# Patient Record
Sex: Female | Born: 1935 | Hispanic: Yes | Smoking: Never smoker
Health system: Southern US, Community
[De-identification: ages and names within clinical notes are randomized; demographics above are authoritative.]

## PROBLEM LIST (undated history)

## (undated) DIAGNOSIS — K279 Peptic ulcer, site unspecified, unspecified as acute or chronic, without hemorrhage or perforation: Secondary | ICD-10-CM

## (undated) DIAGNOSIS — K219 Gastro-esophageal reflux disease without esophagitis: Secondary | ICD-10-CM

## (undated) DIAGNOSIS — A159 Respiratory tuberculosis unspecified: Secondary | ICD-10-CM

## (undated) DIAGNOSIS — J45909 Unspecified asthma, uncomplicated: Secondary | ICD-10-CM

## (undated) HISTORY — PX: ABDOMINAL HYSTERECTOMY: SHX81

## (undated) HISTORY — PX: EYE SURGERY: SHX253

---

## 2017-01-17 ENCOUNTER — Inpatient Hospital Stay (HOSPITAL_COMMUNITY)
Admission: EM | Admit: 2017-01-17 | Discharge: 2017-01-20 | DRG: 369 | Disposition: A | Discharge status: Home / Self Care | Payer: Medicare Other | Attending: Internal Medicine | Admitting: Internal Medicine

## 2017-01-17 ENCOUNTER — Encounter (HOSPITAL_COMMUNITY): Payer: Self-pay

## 2017-01-17 DIAGNOSIS — R451 Restlessness and agitation: Secondary | ICD-10-CM | POA: Diagnosis present

## 2017-01-17 DIAGNOSIS — D500 Iron deficiency anemia secondary to blood loss (chronic): Secondary | ICD-10-CM | POA: Diagnosis not present

## 2017-01-17 DIAGNOSIS — F0280 Dementia in other diseases classified elsewhere without behavioral disturbance: Secondary | ICD-10-CM | POA: Diagnosis present

## 2017-01-17 DIAGNOSIS — K2961 Other gastritis with bleeding: Secondary | ICD-10-CM | POA: Diagnosis present

## 2017-01-17 DIAGNOSIS — K2960 Other gastritis without bleeding: Secondary | ICD-10-CM

## 2017-01-17 DIAGNOSIS — F0390 Unspecified dementia without behavioral disturbance: Secondary | ICD-10-CM | POA: Diagnosis not present

## 2017-01-17 DIAGNOSIS — K922 Gastrointestinal hemorrhage, unspecified: Secondary | ICD-10-CM | POA: Diagnosis present

## 2017-01-17 DIAGNOSIS — K219 Gastro-esophageal reflux disease without esophagitis: Secondary | ICD-10-CM | POA: Diagnosis present

## 2017-01-17 DIAGNOSIS — D62 Acute posthemorrhagic anemia: Secondary | ICD-10-CM | POA: Diagnosis present

## 2017-01-17 DIAGNOSIS — R05 Cough: Secondary | ICD-10-CM | POA: Diagnosis not present

## 2017-01-17 DIAGNOSIS — K921 Melena: Secondary | ICD-10-CM | POA: Diagnosis present

## 2017-01-17 DIAGNOSIS — G309 Alzheimer's disease, unspecified: Secondary | ICD-10-CM | POA: Diagnosis present

## 2017-01-17 DIAGNOSIS — K920 Hematemesis: Secondary | ICD-10-CM

## 2017-01-17 DIAGNOSIS — Z8711 Personal history of peptic ulcer disease: Secondary | ICD-10-CM

## 2017-01-17 DIAGNOSIS — E876 Hypokalemia: Secondary | ICD-10-CM | POA: Diagnosis present

## 2017-01-17 DIAGNOSIS — K319 Disease of stomach and duodenum, unspecified: Secondary | ICD-10-CM | POA: Diagnosis present

## 2017-01-17 DIAGNOSIS — K226 Gastro-esophageal laceration-hemorrhage syndrome: Secondary | ICD-10-CM | POA: Diagnosis present

## 2017-01-17 DIAGNOSIS — D72829 Elevated white blood cell count, unspecified: Secondary | ICD-10-CM | POA: Diagnosis present

## 2017-01-17 DIAGNOSIS — Z90710 Acquired absence of both cervix and uterus: Secondary | ICD-10-CM

## 2017-01-17 DIAGNOSIS — R059 Cough, unspecified: Secondary | ICD-10-CM

## 2017-01-17 HISTORY — DX: Unspecified asthma, uncomplicated: J45.909

## 2017-01-17 HISTORY — DX: Gastro-esophageal reflux disease without esophagitis: K21.9

## 2017-01-17 HISTORY — DX: Respiratory tuberculosis unspecified: A15.9

## 2017-01-17 HISTORY — DX: Peptic ulcer, site unspecified, unspecified as acute or chronic, without hemorrhage or perforation: K27.9

## 2017-01-17 LAB — COMPREHENSIVE METABOLIC PANEL
ALT: 15 U/L (ref 14–54)
ANION GAP: 12 (ref 5–15)
AST: 29 U/L (ref 15–41)
Albumin: 3.3 g/dL — ABNORMAL LOW (ref 3.5–5.0)
Alkaline Phosphatase: 59 U/L (ref 38–126)
BUN: 36 mg/dL — ABNORMAL HIGH (ref 6–20)
CHLORIDE: 99 mmol/L — AB (ref 101–111)
CO2: 23 mmol/L (ref 22–32)
CREATININE: 0.8 mg/dL (ref 0.44–1.00)
Calcium: 8.4 mg/dL — ABNORMAL LOW (ref 8.9–10.3)
Glucose, Bld: 118 mg/dL — ABNORMAL HIGH (ref 65–99)
Potassium: 3 mmol/L — ABNORMAL LOW (ref 3.5–5.1)
Sodium: 134 mmol/L — ABNORMAL LOW (ref 135–145)
Total Bilirubin: 0.4 mg/dL (ref 0.3–1.2)
Total Protein: 6.2 g/dL — ABNORMAL LOW (ref 6.5–8.1)

## 2017-01-17 LAB — TYPE AND SCREEN
ABO/RH(D): O POS
ANTIBODY SCREEN: NEGATIVE

## 2017-01-17 LAB — CBC
HCT: 35.3 % — ABNORMAL LOW (ref 36.0–46.0)
Hemoglobin: 11.4 g/dL — ABNORMAL LOW (ref 12.0–15.0)
MCH: 27.3 pg (ref 26.0–34.0)
MCHC: 32.3 g/dL (ref 30.0–36.0)
MCV: 84.7 fL (ref 78.0–100.0)
PLATELETS: 420 10*3/uL — AB (ref 150–400)
RBC: 4.17 MIL/uL (ref 3.87–5.11)
RDW: 12.6 % (ref 11.5–15.5)
WBC: 15.8 10*3/uL — AB (ref 4.0–10.5)

## 2017-01-17 LAB — ABO/RH: ABO/RH(D): O POS

## 2017-01-17 LAB — PHOSPHORUS: PHOSPHORUS: 3.5 mg/dL (ref 2.5–4.6)

## 2017-01-17 LAB — POC OCCULT BLOOD, ED: Fecal Occult Bld: POSITIVE — AB

## 2017-01-17 LAB — MAGNESIUM: Magnesium: 1.9 mg/dL (ref 1.7–2.4)

## 2017-01-17 MED ORDER — SODIUM CHLORIDE 0.9 % IV SOLN
80.0000 mg | Freq: Once | INTRAVENOUS | Status: AC
  Administered 2017-01-17: 80 mg via INTRAVENOUS
  Filled 2017-01-17: qty 80

## 2017-01-17 MED ORDER — SODIUM CHLORIDE 0.9 % IV SOLN
INTRAVENOUS | Status: DC
  Administered 2017-01-17 – 2017-01-18 (×2): via INTRAVENOUS

## 2017-01-17 MED ORDER — PANTOPRAZOLE SODIUM 40 MG IV SOLR
8.0000 mg/h | INTRAVENOUS | Status: DC
  Administered 2017-01-17: 8 mg/h via INTRAVENOUS
  Filled 2017-01-17 (×3): qty 80

## 2017-01-17 MED ORDER — LORAZEPAM 2 MG/ML IJ SOLN
0.5000 mg | INTRAMUSCULAR | Status: DC | PRN
  Administered 2017-01-17: 0.5 mg via INTRAVENOUS
  Filled 2017-01-17: qty 1

## 2017-01-17 MED ORDER — SODIUM CHLORIDE 0.9 % IV BOLUS (SEPSIS)
1000.0000 mL | Freq: Once | INTRAVENOUS | Status: AC
  Administered 2017-01-17: 1000 mL via INTRAVENOUS

## 2017-01-17 NOTE — ED Triage Notes (Signed)
Pt brought in by EMS, called in by family who reports pt was near syncope, and was not responding at baseline per family.  Pt has had a productive cough for 3 days with clear sputum, and pt began coughing up bright red blood right before EMS arrival. Pt has dementia and is at combative and spit on [employer].  Per EMS upon transport pt had a LOC  For about 30 seconds and when pt became aroused vomited dark red emesis.  Per EMS BP 140/80 then to 103/48,  hr 116, CBG 112, and O2 saturation 95%. Pt was given 4 mg of Zofran.  Per family  pt has not been sick , however has had a poor appetite, and generalized weakness.

## 2017-01-17 NOTE — ED Notes (Signed)
Bed: RESA Expected date:  Expected time:  Means of arrival:  Comments: EMS GI bleed/syncopal-hypotension/orthostatic/dementia

## 2017-01-17 NOTE — ED Provider Notes (Addendum)
[facility]-EMERGENCY DEPT Provider Note   CSN: [id number] Arrival date & time: 01/17/17  1940     History   Chief Complaint Chief Complaint  Patient presents with  . Emesis  . Near Syncope  . Weakness    HPI Sylvia Schwartz is a 83 y.o. female.  HPI  Presenting with hematemesis.  History obtained via spanish interpreter with patient's daughter. Level V caveat due to dementia. History of alzheimer's dementia. Has [place] No prior history of Gi bleed. No blood thinners or use of NSAIDs. Onset of dark hematemesis earlier this afternoon, once at home and once with EMS. Single episode of dark stool with EMS. No abdominal pain, fevers, complaints of chest pain or shortness of breath. Patient was lightheaded and near syncopal per daughter and with EMS, did have syncopal episode. She has had generalized weakness and poor appetite    History reviewed. No pertinent past medical history.  There are no active problems to display for this patient.   No past surgical history on file.  OB History    No data available       Home Medications    Prior to Admission medications   Medication Sig Start Date End Date Taking? Authorizing Provider  guaiFENesin (ROBITUSSIN) 100 MG/5ML liquid Take 200 mg by mouth 3 (three) times daily as needed for cough.   Yes Historical Provider, MD    Family History History reviewed. No pertinent family history.  Social History Social History  Substance Use Topics  . Smoking status: Not on file  . Smokeless tobacco: Not on file  . Alcohol use No     Allergies   Patient has no known allergies.   Review of Systems Review of Systems Unable to obtain due to dementia  Physical Exam Updated Vital Signs BP 116/57 (BP Location: Left Arm)   Pulse 100   Resp 25   SpO2 95%   Physical Exam Physical Exam  Nursing note and vitals reviewed. Constitutional: non-toxic, and in no acute  distress Head: Normocephalic and atraumatic.  Mouth/Throat: Oropharynx is clear and dry.  Neck: Normal range of motion. Neck supple.  Cardiovascular: Tachycardic rate and regular rhythm.  no edema Pulmonary/Chest: Effort normal and breath sounds normal.  Abdominal: Soft. There is no tenderness. There is no rebound and no guarding.  Rectal: melena, guaiac positive stool Musculoskeletal: Normal range of motion.  Neurological: Alert, no facial droop, moves all extremities symmetrically Skin: Skin is warm and dry.  Psychiatric: Cooperative   ED Treatments / Results  Labs (all labs ordered are listed, but only abnormal results are displayed) Labs Reviewed  COMPREHENSIVE METABOLIC PANEL - Abnormal; Notable for the following:       Result Value   Sodium 134 (*)    Potassium 3.0 (*)    Chloride 99 (*)    Glucose, Bld 118 (*)    BUN 36 (*)    Calcium 8.4 (*)    Total Protein 6.2 (*)    Albumin 3.3 (*)    All other components within normal limits  CBC - Abnormal; Notable for the following:    WBC 15.8 (*)    Hemoglobin 11.4 (*)    HCT 35.3 (*)    Platelets 420 (*)    All other components within normal limits  POC OCCULT BLOOD, ED - Abnormal; Notable for the following:    Fecal Occult Bld POSITIVE (*)    All other components within  normal limits  TYPE AND SCREEN  ABO/RH    EKG  EKG Interpretation None       Radiology No results found.  Procedures Procedures (including critical care time)  Medications Ordered in ED Medications  sodium chloride 0.9 % bolus 1,000 mL (not administered)    And  0.9 %  sodium chloride infusion (not administered)  pantoprazole (PROTONIX) 80 mg in sodium chloride 0.9 % 100 mL IVPB (not administered)  pantoprazole (PROTONIX) 80 mg in sodium chloride 0.9 % 250 mL (0.32 mg/mL) infusion (not administered)     Initial Impression / Assessment and Plan / ED Course  I have reviewed the triage vital signs and the nursing notes.  Pertinent labs  & imaging results that were available during my care of the patient were reviewed by me and considered in my medical decision making (see chart for details).     Presenting with concern for UGIB. Tachycardic, but normotensive and no respiratory distress. Abdomen soft and benign. Melena on rectal exam. Blood work with anemia 11.4, no baseline. Increased BUN/Cr ratio. Placed on protonix drip for UGIB. Discussed with Dr. Lavon PaganiniNandigam who will plan to perform endoscopy likely tomorrow morning. Plan to admit to hospitalist service for ongoing management. Discussed with Dr. Robb Matarrtiz  Final Clinical Impressions(s) / ED Diagnoses   Final diagnoses:  UGIB (upper gastrointestinal bleed)  Melena    New Prescriptions New Prescriptions   No medications on file     Lavera Guiseana Duo Jerelene Salaam, MD 01/17/17 [id number]    Lavera Guiseana Duo Jesseka Drinkard, MD 01/17/17 2314

## 2017-01-17 NOTE — ED Notes (Addendum)
Pt refusing EKG, EDP Liu notified and aware.

## 2017-01-18 ENCOUNTER — Inpatient Hospital Stay (HOSPITAL_COMMUNITY): Payer: Medicare Other

## 2017-01-18 ENCOUNTER — Inpatient Hospital Stay (HOSPITAL_COMMUNITY): Payer: Medicare Other | Admitting: Anesthesiology

## 2017-01-18 ENCOUNTER — Encounter (HOSPITAL_COMMUNITY)
Admission: EM | Disposition: A | Discharge status: Home / Self Care | Payer: Self-pay | Source: Home / Self Care | Attending: Internal Medicine

## 2017-01-18 ENCOUNTER — Encounter (HOSPITAL_COMMUNITY): Payer: Self-pay | Admitting: Anesthesiology

## 2017-01-18 DIAGNOSIS — R451 Restlessness and agitation: Secondary | ICD-10-CM | POA: Diagnosis present

## 2017-01-18 DIAGNOSIS — F0390 Unspecified dementia without behavioral disturbance: Secondary | ICD-10-CM | POA: Diagnosis present

## 2017-01-18 DIAGNOSIS — K920 Hematemesis: Secondary | ICD-10-CM

## 2017-01-18 DIAGNOSIS — K226 Gastro-esophageal laceration-hemorrhage syndrome: Principal | ICD-10-CM

## 2017-01-18 DIAGNOSIS — K922 Gastrointestinal hemorrhage, unspecified: Secondary | ICD-10-CM

## 2017-01-18 DIAGNOSIS — K921 Melena: Secondary | ICD-10-CM

## 2017-01-18 DIAGNOSIS — K2960 Other gastritis without bleeding: Secondary | ICD-10-CM

## 2017-01-18 DIAGNOSIS — D500 Iron deficiency anemia secondary to blood loss (chronic): Secondary | ICD-10-CM

## 2017-01-18 HISTORY — PX: ESOPHAGOGASTRODUODENOSCOPY (EGD) WITH PROPOFOL: SHX5813

## 2017-01-18 LAB — COMPREHENSIVE METABOLIC PANEL
ALBUMIN: 3.4 g/dL — AB (ref 3.5–5.0)
ALK PHOS: 54 U/L (ref 38–126)
ALT: 17 U/L (ref 14–54)
AST: 33 U/L (ref 15–41)
Anion gap: 8 (ref 5–15)
BUN: 36 mg/dL — AB (ref 6–20)
CALCIUM: 8.4 mg/dL — AB (ref 8.9–10.3)
CO2: 27 mmol/L (ref 22–32)
CREATININE: 0.71 mg/dL (ref 0.44–1.00)
Chloride: 104 mmol/L (ref 101–111)
GFR calc Af Amer: >60 mL/min (ref >60)
GLUCOSE: 87 mg/dL (ref 65–99)
Potassium: 3.5 mmol/L (ref 3.5–5.1)
Sodium: 139 mmol/L (ref 135–145)
Total Bilirubin: 0.6 mg/dL (ref 0.3–1.2)
Total Protein: 6.3 g/dL — ABNORMAL LOW (ref 6.5–8.1)

## 2017-01-18 LAB — CBC WITH DIFFERENTIAL/PLATELET
BASOS ABS: 0 10*3/uL (ref 0.0–0.1)
BASOS PCT: 0 %
EOS ABS: 0.7 10*3/uL (ref 0.0–0.7)
EOS PCT: 7 %
HCT: 29.5 % — ABNORMAL LOW (ref 36.0–46.0)
Hemoglobin: 9.7 g/dL — ABNORMAL LOW (ref 12.0–15.0)
LYMPHS PCT: 23 %
Lymphs Abs: 2.1 10*3/uL (ref 0.7–4.0)
MCH: 27.9 pg (ref 26.0–34.0)
MCHC: 32.9 g/dL (ref 30.0–36.0)
MCV: 84.8 fL (ref 78.0–100.0)
MONO ABS: 0.8 10*3/uL (ref 0.1–1.0)
Monocytes Relative: 9 %
Neutro Abs: 5.5 10*3/uL (ref 1.7–7.7)
Neutrophils Relative %: 61 %
PLATELETS: 387 10*3/uL (ref 150–400)
RBC: 3.48 MIL/uL — ABNORMAL LOW (ref 3.87–5.11)
RDW: 12.9 % (ref 11.5–15.5)
WBC: 9.1 10*3/uL (ref 4.0–10.5)

## 2017-01-18 LAB — URINALYSIS, ROUTINE W REFLEX MICROSCOPIC
Bilirubin Urine: NEGATIVE
GLUCOSE, UA: NEGATIVE mg/dL
KETONES UR: NEGATIVE mg/dL
NITRITE: NEGATIVE
PROTEIN: NEGATIVE mg/dL
Specific Gravity, Urine: 1.023 (ref 1.005–1.030)
pH: 6 (ref 5.0–8.0)

## 2017-01-18 LAB — TROPONIN I
TROPONIN I: 0.03 ng/mL — AB (ref <0.03)
Troponin I: 0.04 ng/mL (ref <0.03)

## 2017-01-18 LAB — MRSA PCR SCREENING: MRSA BY PCR: NEGATIVE

## 2017-01-18 LAB — CBG MONITORING, ED: Glucose-Capillary: 70 mg/dL (ref 65–99)

## 2017-01-18 SURGERY — ESOPHAGOGASTRODUODENOSCOPY (EGD) WITH PROPOFOL
Anesthesia: Monitor Anesthesia Care

## 2017-01-18 MED ORDER — SODIUM CHLORIDE 0.9% FLUSH
3.0000 mL | Freq: Two times a day (BID) | INTRAVENOUS | Status: DC
  Administered 2017-01-19: 3 mL via INTRAVENOUS

## 2017-01-18 MED ORDER — SODIUM CHLORIDE 0.9 % IV SOLN
INTRAVENOUS | Status: AC
  Administered 2017-01-18 – 2017-01-19 (×2): via INTRAVENOUS
  Filled 2017-01-18 (×3): qty 1000

## 2017-01-18 MED ORDER — HALOPERIDOL LACTATE 5 MG/ML IJ SOLN
2.0000 mg | Freq: Four times a day (QID) | INTRAMUSCULAR | Status: DC | PRN
  Administered 2017-01-18: 2 mg via INTRAVENOUS
  Filled 2017-01-18 (×2): qty 1

## 2017-01-18 MED ORDER — PROPOFOL 10 MG/ML IV BOLUS
INTRAVENOUS | Status: DC | PRN
  Administered 2017-01-18: 150 mg via INTRAVENOUS

## 2017-01-18 MED ORDER — PANTOPRAZOLE SODIUM 40 MG IV SOLR
40.0000 mg | Freq: Two times a day (BID) | INTRAVENOUS | Status: DC
  Administered 2017-01-18: 40 mg via INTRAVENOUS
  Filled 2017-01-18 (×2): qty 40

## 2017-01-18 MED ORDER — ONDANSETRON HCL 4 MG/2ML IJ SOLN
INTRAMUSCULAR | Status: AC
  Filled 2017-01-18: qty 2

## 2017-01-18 MED ORDER — ORAL CARE MOUTH RINSE: 15.0000 mL | Freq: Two times a day (BID) | OROMUCOSAL | Status: DC

## 2017-01-18 MED ORDER — PROPOFOL 10 MG/ML IV BOLUS
INTRAVENOUS | Status: AC
  Filled 2017-01-18: qty 20

## 2017-01-18 MED ORDER — HALOPERIDOL LACTATE 5 MG/ML IJ SOLN
3.0000 mg | Freq: Four times a day (QID) | INTRAMUSCULAR | Status: DC | PRN
  Filled 2017-01-18: qty 1

## 2017-01-18 MED ORDER — QUETIAPINE FUMARATE 50 MG PO TABS
25.0000 mg | ORAL_TABLET | Freq: Every day | ORAL | Status: DC
  Administered 2017-01-18: 25 mg via ORAL
  Filled 2017-01-18: qty 1

## 2017-01-18 MED ORDER — LACTATED RINGERS IV SOLN
INTRAVENOUS | Status: DC
  Administered 2017-01-18: 13:00:00 via INTRAVENOUS
  Administered 2017-01-19: 1000 mL via INTRAVENOUS

## 2017-01-18 MED ORDER — ONDANSETRON HCL 4 MG/2ML IJ SOLN
4.0000 mg | Freq: Four times a day (QID) | INTRAMUSCULAR | Status: DC | PRN
  Administered 2017-01-18: 4 mg via INTRAVENOUS

## 2017-01-18 MED ORDER — CHLORHEXIDINE GLUCONATE 0.12 % MT SOLN
15.0000 mL | Freq: Two times a day (BID) | OROMUCOSAL | Status: DC
  Filled 2017-01-18: qty 15

## 2017-01-18 MED ORDER — SODIUM CHLORIDE 0.9% FLUSH
3.0000 mL | Freq: Two times a day (BID) | INTRAVENOUS | Status: DC
  Administered 2017-01-18: 3 mL via INTRAVENOUS

## 2017-01-18 MED ORDER — HALOPERIDOL LACTATE 5 MG/ML IJ SOLN
2.0000 mg | INTRAMUSCULAR | Status: AC
  Administered 2017-01-18: 2 mg via INTRAVENOUS
  Filled 2017-01-18: qty 1

## 2017-01-18 MED ORDER — FENTANYL CITRATE (PF) 100 MCG/2ML IJ SOLN
INTRAMUSCULAR | Status: DC | PRN
  Administered 2017-01-18: 100 ug via INTRAVENOUS

## 2017-01-18 MED ORDER — PANTOPRAZOLE SODIUM 40 MG PO TBEC
40.0000 mg | DELAYED_RELEASE_TABLET | Freq: Two times a day (BID) | ORAL | Status: DC
  Administered 2017-01-18: 40 mg via ORAL
  Filled 2017-01-18 (×2): qty 1

## 2017-01-18 MED ORDER — DEXAMETHASONE SODIUM PHOSPHATE 10 MG/ML IJ SOLN
INTRAMUSCULAR | Status: AC
  Filled 2017-01-18: qty 1

## 2017-01-18 MED ORDER — FENTANYL CITRATE (PF) 100 MCG/2ML IJ SOLN
INTRAMUSCULAR | Status: AC
  Filled 2017-01-18: qty 2

## 2017-01-18 MED ORDER — ONDANSETRON HCL 4 MG PO TABS: 4.0000 mg | ORAL_TABLET | Freq: Four times a day (QID) | ORAL | Status: DC | PRN

## 2017-01-18 MED ORDER — HALOPERIDOL LACTATE 5 MG/ML IJ SOLN
4.0000 mg | INTRAMUSCULAR | Status: AC
  Administered 2017-01-18: 4 mg via INTRAVENOUS

## 2017-01-18 MED ORDER — SODIUM CHLORIDE 0.9 % IV SOLN: INTRAVENOUS | Status: DC

## 2017-01-18 MED ORDER — LIDOCAINE 2% (20 MG/ML) 5 ML SYRINGE
INTRAMUSCULAR | Status: DC | PRN
  Administered 2017-01-18: 100 mg via INTRAVENOUS

## 2017-01-18 MED ORDER — POTASSIUM CHLORIDE IN NACL 40-0.9 MEQ/L-% IV SOLN
INTRAVENOUS | Status: AC
  Administered 2017-01-18: 75 mL/h via INTRAVENOUS
  Filled 2017-01-18: qty 1000

## 2017-01-18 MED ORDER — SUCCINYLCHOLINE CHLORIDE 200 MG/10ML IV SOSY
PREFILLED_SYRINGE | INTRAVENOUS | Status: DC | PRN
  Administered 2017-01-18: 100 mg via INTRAVENOUS

## 2017-01-18 SURGICAL SUPPLY — 14 items

## 2017-01-18 NOTE — Anesthesia Procedure Notes (Signed)
Procedure Name: Intubation Date/Time: 01/18/2017 1:40 PM Performed by: Rex Oesterle, Virgel Gess Pre-anesthesia Checklist: Patient identified, Emergency Drugs available, Suction available, Patient being monitored and Timeout performed Patient Re-evaluated:Patient Re-evaluated prior to inductionOxygen Delivery Method: Circle system utilized Preoxygenation: Pre-oxygenation with 100% oxygen Intubation Type: IV induction, Cricoid Pressure applied and Rapid sequence Laryngoscope Size: Mac and 4 Grade View: Grade I Tube type: Oral Tube size: 7.5 mm Number of attempts: 1 Airway Equipment and Method: Stylet Placement Confirmation: ETT inserted through vocal cords under direct vision,  positive ETCO2,  CO2 detector and breath sounds checked- equal and bilateral Secured at: 22 cm Tube secured with: Tape Dental Injury: Teeth and Oropharynx as per pre-operative assessment

## 2017-01-18 NOTE — ED Notes (Signed)
CONTACT INFORMATION  MARIE [phone] MOBILE               [phone] WORK

## 2017-01-18 NOTE — ED Notes (Addendum)
CONTACTED X-RAY-REQUESTED  2ND ATTEMPT FOR PORTABLE BEFORE TRANSFER TO 2 WEST

## 2017-01-18 NOTE — ED Notes (Addendum)
BEDSIDE REPORT. PER C. CELESTIAL RN. PT DECLINES TO WEAR CARDIAC MONITOR, DECLINED EKG. SPOKE WITH ASSIST. DIRECTOR JENNIFER H. RN AND REQUESTED TO ASSIST WITH IV  WHEN SHE RETURNED TO FLOOR. SHE AGREED.

## 2017-01-18 NOTE — Progress Notes (Signed)
Patient refusing care. Patient now has soft waist belt applied and Haldol given. Patient sleeping. Family wants patient to sleep. Care for 0000 (CBG) refused. Will continue to monitor patient. Will spot check BP and O2 since patient has pulled off lines.

## 2017-01-18 NOTE — ED Notes (Signed)
IV Team consult @ bedside

## 2017-01-18 NOTE — H&P (Signed)
History and Physical    Sylvia Schwartz ZOX:[id number] DOB: 1928/04/18 DOA: 01/17/2017  PCP: No PCP Per Patient   Patient coming from:  Chief Complaint: Hematemesis  HPI: Sylvia Schwartz is a 83 y.o. female with medical history significant of peptic ulcer disease 17 or 18 years ago, dementia who was brought to the emergency department due hematemesis at home and another episode while being transported by EMS. She has been having productive cough with clear sputum, poor appetite and generalized weakness for the past 3 days, but no fever, chills, complaints of chest or abdominal pain. She does not use aspirin or anticoagulants. She is combative, unable to provide further details. Briefly history is provided by her daughter Sylvia Schwartz.  ED Course: The patient was given normal saline boluses, pantoprazole, Zofran and lorazepam. WBC 15.8, hemoglobin 11.4 g/dL and platelets 811. Her sodium level 134, potassium 3.0, chloride 99, bicarbonate 23 mmol/L. BUN is 36, creatinine 0.8, glucose 118, magnesium 1.9 and phosphorus 3.5 mg/dL.  Review of Systems: As per HPI otherwise 10 point review of systems negative.    Past Medical History:  Diagnosis Date  . PUD (peptic ulcer disease)     Past Surgical History:  Procedure Laterality Date  . ABDOMINAL HYSTERECTOMY    . EYE SURGERY Left    catatact     reports that she has never smoked. She has never used smokeless tobacco. She reports that she does not drink alcohol or use drugs.  No Known Allergies  Family History  Problem Relation Age of Onset  . Family history unknown: Yes  Per patient's daughter, all of her siblings are still alive. Both of her parents were very healthy and pased away of "old age".  Mother died in her 68s and her father almost 89 y/o.  Prior to Admission medications   Medication Sig Start Date End Date Taking? Authorizing Provider  guaiFENesin (ROBITUSSIN) 100 MG/5ML liquid Take 200 mg by mouth 3 (three) times daily  as needed for cough.   Yes Historical Provider, MD    Physical Exam:  Constitutional: NAD, calm, comfortable Vitals:   01/17/17 2140  BP: 116/57  Pulse: 100  Resp: 25  SpO2: 95%   Eyes: PERRL, lids and conjunctivae normal ENMT: Mucous membranes are moist. Posterior pharynx clear of any exudate or lesions. Neck: normal, supple, no masses, no thyromegaly Respiratory: clear to auscultation bilaterally, no wheezing, no crackles. Normal respiratory effort. No accessory muscle use.  Cardiovascular: Regular rate and rhythm, no murmurs / rubs / gallops. No extremity edema. 2+ pedal pulses. No carotid bruits.  Abdomen: no tenderness, no masses palpated. No hepatosplenomegaly. Bowel sounds positive.  Musculoskeletal: no clubbing / cyanosis. No joint deformity upper and lower extremities. Good ROM, no contractures. Normal muscle tone.  Skin: no rashes, lesions, ulcers. Neurologic: CN 2-12 grossly intact. Sensation intact, DTR normal. Strength 5/5 in all 4.  Psychiatric: Normal judgment and insight. Alert and oriented x 3. Normal mood.    Labs on Admission: I have personally reviewed following labs and imaging studies  CBC:  Recent Labs Lab 01/17/17 2054  WBC 15.8*  HGB 11.4*  HCT 35.3*  MCV 84.7  PLT 420*   Basic Metabolic Panel:  Recent Labs Lab 01/17/17 2013 01/17/17 2017  NA  --  134*  K  --  3.0*  CL  --  99*  CO2  --  23  GLUCOSE  --  118*  BUN  --  36*  CREATININE  --  0.80  CALCIUM  --  8.4*  MG 1.9  --   PHOS 3.5  --    GFR: CrCl cannot be calculated (Unknown ideal weight.). Liver Function Tests:  Recent Labs Lab 01/17/17 2017  AST 29  ALT 15  ALKPHOS 59  BILITOT 0.4  PROT 6.2*  ALBUMIN 3.3*   No results for input(s): LIPASE, AMYLASE in the last 168 hours. No results for input(s): AMMONIA in the last 168 hours. Coagulation Profile: No results for input(s): INR, PROTIME in the last 168 hours. Cardiac Enzymes: No results for input(s): CKTOTAL,  CKMB, CKMBINDEX, TROPONINI in the last 168 hours. BNP (last 3 results) No results for input(s): PROBNP in the last 8760 hours. HbA1C: No results for input(s): HGBA1C in the last 72 hours. CBG: No results for input(s): GLUCAP in the last 168 hours. Lipid Profile: No results for input(s): CHOL, HDL, LDLCALC, TRIG, CHOLHDL, LDLDIRECT in the last 72 hours. Thyroid Function Tests: No results for input(s): TSH, T4TOTAL, FREET4, T3FREE, THYROIDAB in the last 72 hours. Anemia Panel: No results for input(s): VITAMINB12, FOLATE, FERRITIN, TIBC, IRON, RETICCTPCT in the last 72 hours. Urine analysis: No results found for: COLORURINE, APPEARANCEUR, LABSPEC, PHURINE, GLUCOSEU, HGBUR, BILIRUBINUR, KETONESUR, PROTEINUR, UROBILINOGEN, NITRITE, LEUKOCYTESUR   Radiological Exams on Admission: No results found.  EKG: Independently reviewed. Pending.  Assessment/Plan Principal Problem:   UGI bleed Admit to stepdown/inpatient. Continue pantoprazole infusion. Monitor hematocrit and hemoglobin. Transfuse as needed. GI will evaluate in the morning.  Active Problems:   Hypokalemia Replacing. Monitor potassium level    Anemia due to blood loss Monitor H&H.    Leukocytosis Monitor WBC.    Dementia   Restlessness Lorazepam 0.5 mg IVP every 4 hours as needed for restlessness or aggressive behavior.    DVT prophylaxis: SCDs. Code Status: Full code. Family Communication: Her daughter Sylvia DroneBrenda was present in the room. Disposition Plan:  Consults called: Gastroenterology (Dr. Gasper LloydNandigan).  Admission status: Inpatient/stepdown.   Bobette Moavid Manuel Malya Cirillo MD [facility] Pager [phone].  If 7PM-7AM, please contact night-coverage [email or web address] Password [email or web address]  01/18/2017, 12:01 AM

## 2017-01-18 NOTE — ED Notes (Signed)
Pt removed IV Dr. Eleonore ChiquitoKarh was made aware.

## 2017-01-18 NOTE — ED Notes (Signed)
Documentation error current IV infusing.

## 2017-01-18 NOTE — Progress Notes (Signed)
Patient refuses for RN to touch chest area. EKG 12 lead unable to be performed to get QTc. Patient combative. [facility] and is 0.45. QTc was needed for Haldol.

## 2017-01-18 NOTE — ED Notes (Signed)
PT refused to allow RN to obtain 0300 Labs Trop, and Hemoglobin A1c, Pt became combative.

## 2017-01-18 NOTE — H&P (View-Only) (Signed)
Consultation  Referring Provider:  Dr. David StallFeliz Ortiz    Primary Care Physician:  No PCP Per Patient Primary Gastroenterologist: Gentry FitzUnassigned        Reason for Consultation: Hematemesis, Melena             HPI:   Sylvia Schwartz is a 83 y.o. hispanic female with past medical history of Alzheimer's dementia and PUD 17-18 years ago who presented to the ED accompanied by her daughter, for an episode of hematemesis. It should be noted that at the time of my interview the patient has just been sedated with lorazepam and is unable to provide history. Per daughter the patient has had a productive cough with clear sputum over the past 3-4 days and has been weak, "just sitting on the couch". She has also had a decreased appetite for 6-8 days, but has not been complaining of any abdominal pain per her daughter's knowledge. She tells me that last night she fell back into a "stupor", and started making some "weird noises", so her daughter awakened her and upon awakening the patient vomited a darker looking bloody material accompanied by other liquid. The patient's daughter called the ambulance and the patient again vomited on her way to the ambulance. This was outside so the daughter is unsure if there was blood in this are not. She tells me that it was a larger quantity than previously. She then also had a very "dark and smelly" stool on the way to the hospital. Patient's daughter tells me she has a history of Alzheimer's and she does take sole care of her. She is not on any chronic medications. She has been using Robitussin over the past 3-4 days for cough.   Patient's daughter denies abdominal pain, heartburn or reflux chronically as well as any use of NSAIDs.  ED Course: The patient was given normal saline boluses, pantoprazole, Zofran and lorazepam. WBC 15.8, hemoglobin 11.4 g/dL and platelets 409420. Her sodium level 134, potassium 3.0, chloride 99, bicarbonate 23 mmol/L. BUN is 36, creatinine 0.8, glucose  118, magnesium 1.9 and phosphorus 3.5 mg/dL.  Past Medical History:  Diagnosis Date  . PUD (peptic ulcer disease)     Past Surgical History:  Procedure Laterality Date  . ABDOMINAL HYSTERECTOMY    . EYE SURGERY Left    catatact    Family History  Problem Relation Age of Onset  . Family history unknown: Yes     Social History  Substance Use Topics  . Smoking status: Never Smoker  . Smokeless tobacco: Never Used  . Alcohol use No    Prior to Admission medications   Medication Sig Start Date End Date Taking? Authorizing Provider  guaiFENesin (ROBITUSSIN) 100 MG/5ML liquid Take 200 mg by mouth 3 (three) times daily as needed for cough.   Yes Historical Provider, MD    Current Facility-Administered Medications  Medication Dose Route Frequency Provider Last Rate Last Dose  . 0.9 %  sodium chloride infusion   Intravenous Continuous Lavera Guiseana Duo Liu, MD 125 mL/hr at 01/17/17 2230    . 0.9 % NaCl with KCl 40 mEq / L  infusion   Intravenous Continuous Bobette Moavid Manuel Ortiz, MD 75 mL/hr at 01/18/17 0326 75 mL/hr at 01/18/17 0326  . LORazepam (ATIVAN) injection 0.5 mg  0.5 mg Intravenous Q4H PRN Bobette Moavid Manuel Ortiz, MD   0.5 mg at 01/17/17 2352  . ondansetron (ZOFRAN) tablet 4 mg  4 mg Oral Q6H PRN Bobette Moavid Manuel Ortiz, MD  Or  . ondansetron (ZOFRAN) injection 4 mg  4 mg Intravenous Q6H PRN Bobette Moavid Manuel Ortiz, MD      . pantoprazole (PROTONIX) 80 mg in sodium chloride 0.9 % 250 mL (0.32 mg/mL) infusion  8 mg/hr Intravenous Continuous Lavera Guiseana Duo Liu, MD 25 mL/hr at 01/17/17 2333 8 mg/hr at 01/17/17 2333  . sodium chloride flush (NS) 0.9 % injection 3 mL  3 mL Intravenous Q12H Bobette Moavid Manuel Ortiz, MD      . sodium chloride flush (NS) 0.9 % injection 3 mL  3 mL Intravenous Q12H Bobette Moavid Manuel Ortiz, MD       Current Outpatient Prescriptions  Medication Sig Dispense Refill  . guaiFENesin (ROBITUSSIN) 100 MG/5ML liquid Take 200 mg by mouth 3 (three) times daily as needed for cough.       Allergies as of 01/17/2017  . (No Known Allergies)     Review of Systems:   Full review of systems unable to be obtained due to pt mental status  Constitutional: Positive for weakness and fatigue Skin: No rash  Cardiovascular: No chest pain Respiratory: Positive for cough Gastrointestinal: See HPI and otherwise negative Genitourinary: No dysuria or change in urinary frequency Neurological: No syncope Musculoskeletal: No new muscle pain Hematologic: Positive for hematemesis Psychiatric: No history of depression or anxiety   Physical Exam:  Vital signs in last 24 hours: Pulse Rate:  [97-107] 107 (01/31 0718) Resp:  [18-25] 18 (01/31 0718) BP: (116-131)/(52-57) 131/52 (01/31 0718) SpO2:  [94 %-95 %] 95 % (01/31 0718)   General:  Sedated Hispanic female appears to be in NAD, Well developed, Well nourished, alert and cooperative Head:  Normocephalic and atraumatic. Eyes:   Unable to assess as they were closed Ears:  Normal auditory acuity. Neck:  Supple Throat: Oral cavity and pharynx without inflammation, swelling or lesion. Teeth in good condition. Lungs: Respirations even and unlabored. Lungs clear to auscultation bilaterally.   No wheezes, crackles, or rhonchi.  Heart: Normal S1, S2. No MRG. Regular rate and rhythm. No peripheral edema, cyanosis or pallor.  Abdomen:  Soft, nondistended, mild tenderness to palpation in epigastrium. No rebound or guarding. Normal bowel sounds. No appreciable masses or hepatomegaly. Rectal:  Not performed.  Msk:  Symmetrical without gross deformities. Peripheral pulses intact.  Extremities:  Without edema, no deformity or joint abnormality.  Neurologic:  Unable to assess Skin:   Dry and intact without significant lesions or rashes. Psychiatric: Sedated and lethargic   LAB RESULTS:  Recent Labs  01/17/17 2054  WBC 15.8*  HGB 11.4*  HCT 35.3*  PLT 420*   BMET  Recent Labs  01/17/17 2017  NA 134*  K 3.0*  CL 99*  CO2 23   GLUCOSE 118*  BUN 36*  CREATININE 0.80  CALCIUM 8.4*   LFT  Recent Labs  01/17/17 2017  PROT 6.2*  ALBUMIN 3.3*  AST 29  ALT 15  ALKPHOS 59  BILITOT 0.4   PREVIOUS ENDOSCOPIES:            None on record   Impression / Plan:   Impression: 1. Upper GI bleed: Patient started vomiting blood, "dark in color", per her daughter last night, 2 episodes, also melena in the ambulance on the hospital, patients also tells me she also vomited some blood around 7:30 this morning but has not vomited since 2. Anemia due to blood loss: Most recent hemoglobin noted at 11.4, another is being processed 3. Alzheimer's dementia: Some restlessness, patient sedated with lorazepam at time  of my exam today, her daughter is her caretaker  Plan: 1. Scheduled patient for an EGD later this afternoon around 2:00 with Dr. Russella Dar. Discussed risks, benefits, limitations and alternatives with the patient's daughter who does consent for the procedure 2. Agree with PPI 3. Continue monitoring hemoglobin with transfusion as needed less than 7 4. Patient to remain nothing by mouth until after time procedure 5. Please await any further recommendations from Dr. Russella Dar after time of EGD  Thank you for your kind consultation, we will continue to follow.  Violet Baldy Lemmon  01/18/2017, 9:17 AM Pager #: [phone]     Attending physician's note   I have taken a history, examined the patient and reviewed the chart. I agree with the Advanced Practitioner's note, impression and recommendations. Acute UGI bleed with hematemesis and melena in pt with demential and a history of PUD. IV PPI, NPO, trend CBC and EGD today.   Claudette Head, MD [facility] [phone] Mon-Fri 8a-5p [phone] after 5p, weekends, holidays

## 2017-01-18 NOTE — Interval H&P Note (Signed)
History and Physical Interval Note:  01/18/2017 1:19 PM  Sylvia SizerMaria Schwartz  has presented today for surgery, with the diagnosis of Hematemesis, Melena  The various methods of treatment have been discussed with the patient and family. After consideration of risks, benefits and other options for treatment, the patient has consented to  Procedure(s): ESOPHAGOGASTRODUODENOSCOPY (EGD) WITH PROPOFOL (N/A) as a surgical intervention .  The patient's history has been reviewed, patient examined, no change in status, stable for surgery.  I have reviewed the patient's chart and labs.  Questions were answered to the patient's satisfaction.     Venita LickMalcolm T. Russella DarStark

## 2017-01-18 NOTE — Anesthesia Preprocedure Evaluation (Signed)
Anesthesia Evaluation  Patient identified by MRN, date of birth, ID band Patient awake    Reviewed: Allergy & Precautions, NPO status , Patient's Chart, lab work & pertinent test results  Airway Mallampati: I  TM Distance: >3 FB Neck ROM: Full    Dental   Pulmonary    Pulmonary exam normal        Cardiovascular Normal cardiovascular exam     Neuro/Psych    GI/Hepatic GERD  Controlled and Medicated,  Endo/Other    Renal/GU      Musculoskeletal   Abdominal   Peds  Hematology   Anesthesia Other Findings   Reproductive/Obstetrics                             Anesthesia Physical Anesthesia Plan  ASA: III  Anesthesia Plan: MAC   Post-op Pain Management:    Induction: Intravenous  Airway Management Planned: Simple Face Mask  Additional Equipment:   Intra-op Plan:   Post-operative Plan:   Informed Consent: I have reviewed the patients History and Physical, chart, labs and discussed the procedure including the risks, benefits and alternatives for the proposed anesthesia with the patient or authorized representative who has indicated his/her understanding and acceptance.     Plan Discussed with: CRNA and Surgeon  Anesthesia Plan Comments:         Anesthesia Quick Evaluation

## 2017-01-18 NOTE — ED Notes (Signed)
Made Dr, Robb Matarrtiz aware that Pt Sylvia Schwartz was positive.

## 2017-01-18 NOTE — Progress Notes (Signed)
ED CM spoke with 3 female visitors in pt ED #16 room who states pt has a pcp but at this time they can not think of the name of the pcp but will provide name of pcp to RN when available

## 2017-01-18 NOTE — Anesthesia Postprocedure Evaluation (Signed)
Anesthesia Post Note  Patient: Sylvia Schwartz  Procedure(s) Performed: Procedure(s) (LRB): ESOPHAGOGASTRODUODENOSCOPY (EGD) WITH PROPOFOL (N/A)  Patient location during evaluation: PACU Anesthesia Type: MAC Level of consciousness: awake and alert Pain management: pain level controlled Vital Signs Assessment: post-procedure vital signs reviewed and stable Respiratory status: spontaneous breathing, nonlabored ventilation, respiratory function stable and patient connected to nasal cannula oxygen Cardiovascular status: blood pressure returned to baseline and stable Postop Assessment: no signs of nausea or vomiting Anesthetic complications: no       Last Vitals:  Vitals:   01/18/17 1410 01/18/17 1420  BP: (!) 123/44 (!) 123/92  Pulse: (!) 101 (!) 101  Resp: (!) 25 16  Temp:      Last Pain:  Vitals:   01/18/17 1359  TempSrc: Oral                 Orestes Geiman DAVID

## 2017-01-18 NOTE — Transfer of Care (Signed)
Immediate Anesthesia Transfer of Care Note  Patient: Sylvia Schwartz  Procedure(s) Performed: Procedure(s): ESOPHAGOGASTRODUODENOSCOPY (EGD) WITH PROPOFOL (N/A)  Patient Location: PACU  Anesthesia Type:General  Level of Consciousness:  sedated, patient cooperative and responds to stimulation  Airway & Oxygen Therapy:Patient Spontanous Breathing and Patient connected to face mask oxgen  Post-op Assessment:  Report given to PACU RN and Post -op Vital signs reviewed and stable  Post vital signs:  Reviewed and stable  Last Vitals:  Vitals:   01/18/17 1308 01/18/17 1359  BP: (!) 133/53 (!) 117/41  Pulse: (!) 114 100  Resp: (!) 24 (!) 21  Temp: 36.7 C     Complications: No apparent anesthesia complications

## 2017-01-18 NOTE — Consult Note (Addendum)
Consultation  Referring Provider:  Dr. David StallFeliz Ortiz    Primary Care Physician:  No PCP Per Patient Primary Gastroenterologist: Gentry FitzUnassigned        Reason for Consultation: Hematemesis, Melena             HPI:   Sylvia Schwartz is a 83 y.o. hispanic female with past medical history of Alzheimer's dementia and PUD 17-18 years ago who presented to the ED accompanied by her daughter, for an episode of hematemesis. It should be noted that at the time of my interview the patient has just been sedated with lorazepam and is unable to provide history. Per daughter the patient has had a productive cough with clear sputum over the past 3-4 days and has been weak, "just sitting on the couch". She has also had a decreased appetite for 6-8 days, but has not been complaining of any abdominal pain per her daughter's knowledge. She tells me that last night she fell back into a "stupor", and started making some "weird noises", so her daughter awakened her and upon awakening the patient vomited a darker looking bloody material accompanied by other liquid. The patient's daughter called the ambulance and the patient again vomited on her way to the ambulance. This was outside so the daughter is unsure if there was blood in this are not. She tells me that it was a larger quantity than previously. She then also had a very "dark and smelly" stool on the way to the hospital. Patient's daughter tells me she has a history of Alzheimer's and she does take sole care of her. She is not on any chronic medications. She has been using Robitussin over the past 3-4 days for cough.   Patient's daughter denies abdominal pain, heartburn or reflux chronically as well as any use of NSAIDs.  ED Course: The patient was given normal saline boluses, pantoprazole, Zofran and lorazepam. WBC 15.8, hemoglobin 11.4 g/dL and platelets 409420. Her sodium level 134, potassium 3.0, chloride 99, bicarbonate 23 mmol/L. BUN is 36, creatinine 0.8, glucose  118, magnesium 1.9 and phosphorus 3.5 mg/dL.  Past Medical History:  Diagnosis Date  . PUD (peptic ulcer disease)     Past Surgical History:  Procedure Laterality Date  . ABDOMINAL HYSTERECTOMY    . EYE SURGERY Left    catatact    Family History  Problem Relation Age of Onset  . Family history unknown: Yes     Social History  Substance Use Topics  . Smoking status: Never Smoker  . Smokeless tobacco: Never Used  . Alcohol use No    Prior to Admission medications   Medication Sig Start Date End Date Taking? Authorizing Provider  guaiFENesin (ROBITUSSIN) 100 MG/5ML liquid Take 200 mg by mouth 3 (three) times daily as needed for cough.   Yes Historical Provider, MD    Current Facility-Administered Medications  Medication Dose Route Frequency Provider Last Rate Last Dose  . 0.9 %  sodium chloride infusion   Intravenous Continuous Lavera Guiseana Duo Liu, MD 125 mL/hr at 01/17/17 2230    . 0.9 % NaCl with KCl 40 mEq / L  infusion   Intravenous Continuous Bobette Moavid Manuel Ortiz, MD 75 mL/hr at 01/18/17 0326 75 mL/hr at 01/18/17 0326  . LORazepam (ATIVAN) injection 0.5 mg  0.5 mg Intravenous Q4H PRN Bobette Moavid Manuel Ortiz, MD   0.5 mg at 01/17/17 2352  . ondansetron (ZOFRAN) tablet 4 mg  4 mg Oral Q6H PRN Bobette Moavid Manuel Ortiz, MD  Or  . ondansetron (ZOFRAN) injection 4 mg  4 mg Intravenous Q6H PRN Bobette Moavid Manuel Ortiz, MD      . pantoprazole (PROTONIX) 80 mg in sodium chloride 0.9 % 250 mL (0.32 mg/mL) infusion  8 mg/hr Intravenous Continuous Lavera Guiseana Duo Liu, MD 25 mL/hr at 01/17/17 2333 8 mg/hr at 01/17/17 2333  . sodium chloride flush (NS) 0.9 % injection 3 mL  3 mL Intravenous Q12H Bobette Moavid Manuel Ortiz, MD      . sodium chloride flush (NS) 0.9 % injection 3 mL  3 mL Intravenous Q12H Bobette Moavid Manuel Ortiz, MD       Current Outpatient Prescriptions  Medication Sig Dispense Refill  . guaiFENesin (ROBITUSSIN) 100 MG/5ML liquid Take 200 mg by mouth 3 (three) times daily as needed for cough.       Allergies as of 01/17/2017  . (No Known Allergies)     Review of Systems:   Full review of systems unable to be obtained due to pt mental status  Constitutional: Positive for weakness and fatigue Skin: No rash  Cardiovascular: No chest pain Respiratory: Positive for cough Gastrointestinal: See HPI and otherwise negative Genitourinary: No dysuria or change in urinary frequency Neurological: No syncope Musculoskeletal: No new muscle pain Hematologic: Positive for hematemesis Psychiatric: No history of depression or anxiety   Physical Exam:  Vital signs in last 24 hours: Pulse Rate:  [97-107] 107 (01/31 0718) Resp:  [18-25] 18 (01/31 0718) BP: (116-131)/(52-57) 131/52 (01/31 0718) SpO2:  [94 %-95 %] 95 % (01/31 0718)   General:  Sedated Hispanic female appears to be in NAD, Well developed, Well nourished, alert and cooperative Head:  Normocephalic and atraumatic. Eyes:   Unable to assess as they were closed Ears:  Normal auditory acuity. Neck:  Supple Throat: Oral cavity and pharynx without inflammation, swelling or lesion. Teeth in good condition. Lungs: Respirations even and unlabored. Lungs clear to auscultation bilaterally.   No wheezes, crackles, or rhonchi.  Heart: Normal S1, S2. No MRG. Regular rate and rhythm. No peripheral edema, cyanosis or pallor.  Abdomen:  Soft, nondistended, mild tenderness to palpation in epigastrium. No rebound or guarding. Normal bowel sounds. No appreciable masses or hepatomegaly. Rectal:  Not performed.  Msk:  Symmetrical without gross deformities. Peripheral pulses intact.  Extremities:  Without edema, no deformity or joint abnormality.  Neurologic:  Unable to assess Skin:   Dry and intact without significant lesions or rashes. Psychiatric: Sedated and lethargic   LAB RESULTS:  Recent Labs  01/17/17 2054  WBC 15.8*  HGB 11.4*  HCT 35.3*  PLT 420*   BMET  Recent Labs  01/17/17 2017  NA 134*  K 3.0*  CL 99*  CO2 23   GLUCOSE 118*  BUN 36*  CREATININE 0.80  CALCIUM 8.4*   LFT  Recent Labs  01/17/17 2017  PROT 6.2*  ALBUMIN 3.3*  AST 29  ALT 15  ALKPHOS 59  BILITOT 0.4   PREVIOUS ENDOSCOPIES:            None on record   Impression / Plan:   Impression: 1. Upper GI bleed: Patient started vomiting blood, "dark in color", per her daughter last night, 2 episodes, also melena in the ambulance on the hospital, patients also tells me she also vomited some blood around 7:30 this morning but has not vomited since 2. Anemia due to blood loss: Most recent hemoglobin noted at 11.4, another is being processed 3. Alzheimer's dementia: Some restlessness, patient sedated with lorazepam at time  of my exam today, her daughter is her caretaker  Plan: 1. Scheduled patient for an EGD later this afternoon around 2:00 with Dr. Russella Dar. Discussed risks, benefits, limitations and alternatives with the patient's daughter who does consent for the procedure 2. Agree with PPI 3. Continue monitoring hemoglobin with transfusion as needed less than 7 4. Patient to remain nothing by mouth until after time procedure 5. Please await any further recommendations from Dr. Russella Dar after time of EGD  Thank you for your kind consultation, we will continue to follow.  Violet Baldy Lemmon  01/18/2017, 9:17 AM Pager #: [phone]     Attending physician's note   I have taken a history, examined the patient and reviewed the chart. I agree with the Advanced Practitioner's note, impression and recommendations. Hemodynamically stable acute UGI bleed with hematemesis and melena in pt with dementia and a history of PUD. R/O ulcer, MW tear, esophagitis, neoplasm, etc. IV PPI, NPO, trend CBC and EGD today.   Claudette Head, MD Clementeen Graham [phone] Mon-Fri 8a-5p [phone] after 5p, weekends, holidays

## 2017-01-18 NOTE — Op Note (Addendum)
[facility] Patient Name: Sylvia Schwartz Procedure Date: 01/18/2017 MRN: [id number] Attending MD: Meryl Dare , MD Date of Birth: 03-25-1928 CSN: [id number] Age: 83 Admit Type: Inpatient Procedure:                Upper GI endoscopy Indications:              Hematemesis, Melena, Suspected upper                            gastrointestinal bleeding Providers:                Venita Lick. Russella Dar, MD, Norman Clay, RN, Beryle Beams,                            Technician, Greig Right, CRNA Referring MD:             [facility] Medicines:                Monitored Anesthesia Care Complications:            No immediate complications. Estimated Blood Loss:     Estimated blood loss was minimal. Procedure:                Pre-Anesthesia Assessment:                           - Prior to the procedure, a History and Physical                            was performed, and patient medications and                            allergies were reviewed. The patient's tolerance of                            previous anesthesia was also reviewed. The risks                            and benefits of the procedure and the sedation                            options and risks were discussed with the patient.                            All questions were answered, and informed consent                            was obtained. Prior Anticoagulants: The patient has                            taken no previous anticoagulant or antiplatelet                            agents. ASA Grade Assessment: III - A patient with  severe systemic disease. After reviewing the risks                            and benefits, the patient was deemed in                            satisfactory condition to undergo the procedure.                           After obtaining informed consent, the endoscope was                            passed under direct vision. Throughout the            procedure, the patient's blood pressure, pulse, and                            oxygen saturations were monitored continuously. The                            EG-[id number] ([id number]) scope was introduced through the                            mouth, and advanced to the second part of duodenum.                            The upper GI endoscopy was accomplished without                            difficulty. The patient tolerated the procedure                            well. Scope In: Scope Out: Findings:      The examined esophagus was normal.      A 10 mm non-bleeding Mallory-Weiss tear with no stigmata of recent       bleeding was found in the gastric cardia.      A few dispersed, 3 to 5 mm non-bleeding erosions were found in the       gastric body and in the gastric antrum. There were no stigmata of recent       bleeding. Biopsies were taken with a cold forceps for histology.      The exam of the stomach was otherwise normal.      The duodenal bulb and second portion of the duodenum were normal. Impression:               - Normal esophagus.                           - Mallory-Weiss tear, non bleeding.                           - Non-bleeding erosive gastropathy. Biopsied.                           - Normal duodenal bulb and second portion of the  duodenum. Moderate Sedation:      N/A- Per Anesthesia Care Recommendation:           - Return patient to hospital ward for ongoing care.                           - Full liquid diet today, advance as tolerated                            tomorrow.                           - No aspirin, ibuprofen, naproxen, or other                            non-steroidal anti-inflammatory drugs long term.                           - Protonix (pantoprazole) 40 mg PO BID for 1 month                            and then 40 mg PO QAM indefinitely.                           - Await pathology results.                           -  Follow up with PCP. GI follow up prn. Procedure Code(s):        --- Professional ---                           [phone], Esophagogastroduodenoscopy, flexible,                            transoral; with biopsy, single or multiple Diagnosis Code(s):        --- Professional ---                           K22.6, Gastro-esophageal laceration-hemorrhage                            syndrome                           K31.89, Other diseases of stomach and duodenum                           K92.0, Hematemesis                           K92.1, Melena (includes Hematochezia) CPT copyright 2016 American Medical Association. All rights reserved. The codes documented in this report are preliminary and upon coder review may  be revised to meet current compliance requirements. Meryl DareMalcolm T Stark, MD 01/18/2017 1:47:01 PM This report has been signed electronically. Number of Addenda: 0

## 2017-01-18 NOTE — Progress Notes (Signed)
[facility] PROGRESS NOTE    Progress Note  Sylvia Schwartz  UEA:[id number] DOB: August 10, 1928 DOA: 01/17/2017 PCP: No PCP Per Patient     Brief Narrative:   Sylvia Schwartz is an 83 y.o. female  with medical history significant of peptic ulcer disease 17 or 18 years ago, dementia who was brought to the emergency department due hematemesis at home and another episode while being transported by EMS. Comes in for coffee-ground emesis that started on the day of admission GI was consulted who proceeded with an EGD that showed several mallory weis tear  Assessment/Plan:   Acute upper GI bleed/Anemia due to blood loss due Mallory-Weiss tear and Erosive gastritis She was started on IV Protonix twice a day which we'll continue for additional 24 hours. GI was consulted recommended an EGD performed on 01/18/2017 that showed several Mallory-Weiss tear with Her hemoglobin dropped from 11.4-9.7 her thrombocytosis is resolved as well as her leukocytosis. A CBC in the morning. Allow clear liq. Diet. No history of NSAIDs.  Hypokalemia: Change IV fluids to normal saline with potassium check a basic metabolic panel in the morning.    Leukocytosis/Thrombocytosis: I stressed emargination continue hold antibiotics instrument a febrile.  Dementia without behavioral disturbance: Haldol PRN  DVT prophylaxis: SCD Family Communication:daughter Disposition Plan/Barrier to D/C: home in am if tolerating diet. Code Status:     Code Status Orders        Start     Ordered   01/18/17 0145  Full code  Continuous     01/18/17 0144    Code Status History    Date Active Date Inactive Code Status Order ID Comments User Context   01/18/2017  1:44 AM  Full Code [id number]  Bobette Mo, MD ED    Advance Directive Documentation   Flowsheet Row Most Recent Value  Type of Advance Directive  Healthcare Power of Attorney  Pre-existing out of facility DNR order (yellow form or pink MOST form)  No  data  "MOST" Form in Place?  No data        IV Access:    Peripheral IV   Procedures and diagnostic studies:   Dg Chest Port 1 View  Result Date: 01/18/2017 CLINICAL DATA:  Vomiting blood EXAM: PORTABLE CHEST 1 VIEW COMPARISON:  None. FINDINGS: Cardiac shadow is within normal limits. Aortic calcifications are seen. Eventration of the right hemidiaphragm is noted. Some scarring is noted in the left apex. No acute bony abnormality is seen. IMPRESSION: No acute abnormality noted. Electronically Signed   By: Alcide Clever M.D.   On: 01/18/2017 12:30     Medical Consultants:    None.  Anti-Infectives:   None  Subjective:    Sylvia Schwartz  patient is denying any abdominal pain.  Objective:    Vitals:   01/18/17 1410 01/18/17 1420 01/18/17 1446 01/18/17 1500  BP: (!) 123/44 (!) 123/92 (!) 142/49   Pulse: (!) 101 (!) 101  91  Resp: (!) 25 16 (!) 27 (!) 23  Temp:      TempSrc:      SpO2: 96% 96% 96% 95%  Weight:      Height:        Intake/Output Summary (Last 24 hours) at 01/18/17 1630 Last data filed at 01/18/17 1500  Gross per 24 hour  Intake             2550 ml  Output  0 ml  Net             2550 ml   Filed Weights   01/18/17 1200 01/18/17 1308  Weight: 59 kg (130 lb) 59 kg (130 lb)    Exam: General exam: In no acute distress. Respiratory system: Good air movement and clear to auscultation. Cardiovascular system: S1 & S2 heard, RRR. No JVD. Gastrointestinal system: Abdomen is nondistended, soft and nontender.  Extremities: No pedal edema. Skin: No rashes, lesions or ulcers Psychiatry: Poor judgement and insight appear normal.    Data Reviewed:    Labs: Basic Metabolic Panel:  Recent Labs Lab 01/17/17 2013 01/17/17 2017 01/18/17 0940  NA  --  134* 139  K  --  3.0* 3.5  CL  --  99* 104  CO2  --  23 27  GLUCOSE  --  118* 87  BUN  --  36* 36*  CREATININE  --  0.80 0.71  CALCIUM  --  8.4* 8.4*  MG 1.9  --   --   PHOS  3.5  --   --    GFR Estimated Creatinine Clearance: 39.9 mL/min (by C-G formula based on SCr of 0.71 mg/dL). Liver Function Tests:  Recent Labs Lab 01/17/17 2017 01/18/17 0940  AST 29 33  ALT 15 17  ALKPHOS 59 54  BILITOT 0.4 0.6  PROT 6.2* 6.3*  ALBUMIN 3.3* 3.4*   No results for input(s): LIPASE, AMYLASE in the last 168 hours. No results for input(s): AMMONIA in the last 168 hours. Coagulation profile No results for input(s): INR, PROTIME in the last 168 hours.  CBC:  Recent Labs Lab 01/17/17 2054 01/18/17 0940  WBC 15.8* 9.1  NEUTROABS  --  5.5  HGB 11.4* 9.7*  HCT 35.3* 29.5*  MCV 84.7 84.8  PLT 420* 387   Cardiac Enzymes:  Recent Labs Lab 01/17/17 2341 01/18/17 0940  TROPONINI 0.04* 0.03*   BNP (last 3 results) No results for input(s): PROBNP in the last 8760 hours. CBG:  Recent Labs Lab 01/18/17 1207  GLUCAP 70   D-Dimer: No results for input(s): DDIMER in the last 72 hours. Hgb A1c: No results for input(s): HGBA1C in the last 72 hours. Lipid Profile: No results for input(s): CHOL, HDL, LDLCALC, TRIG, CHOLHDL, LDLDIRECT in the last 72 hours. Thyroid function studies: No results for input(s): TSH, T4TOTAL, T3FREE, THYROIDAB in the last 72 hours.  Invalid input(s): FREET3 Anemia work up: No results for input(s): VITAMINB12, FOLATE, FERRITIN, TIBC, IRON, RETICCTPCT in the last 72 hours. Sepsis Labs:  Recent Labs Lab 01/17/17 2054 01/18/17 0940  WBC 15.8* 9.1   Microbiology Recent Results (from the past 240 hour(s))  MRSA PCR Screening     Status: None   Collection Time: 01/18/17  2:40 PM  Result Value Ref Range Status   MRSA by PCR NEGATIVE NEGATIVE Final    Comment:        The GeneXpert MRSA Assay (FDA approved for NASAL specimens only), is one component of a comprehensive MRSA colonization surveillance program. It is not intended to diagnose MRSA infection nor to guide or monitor treatment for MRSA infections.       Medications:   . chlorhexidine  15 mL Mouth Rinse BID  . mouth rinse  15 mL Mouth Rinse q12n4p  . pantoprazole  40 mg Oral BID  . sodium chloride flush  3 mL Intravenous Q12H  . sodium chloride flush  3 mL Intravenous Q12H   Continuous Infusions: . sodium  chloride 125 mL/hr at 01/18/17 1515  . lactated ringers      Time spent: 25 min   LOS: 1 day   Marinda ElkFELIZ ORTIZ, ABRAHAM  [facility] Pager [phone]  *Please refer to [email or web address], password [email or web address] to get updated schedule on who will round on this patient, as hospitalists switch teams weekly. If 7PM-7AM, please contact night-coverage at [email or web address], password [email or web address] for any overnight needs.  01/18/2017, 4:30 PM

## 2017-01-19 ENCOUNTER — Encounter (HOSPITAL_COMMUNITY): Payer: Self-pay | Admitting: Gastroenterology

## 2017-01-19 DIAGNOSIS — D72829 Elevated white blood cell count, unspecified: Secondary | ICD-10-CM

## 2017-01-19 DIAGNOSIS — E876 Hypokalemia: Secondary | ICD-10-CM

## 2017-01-19 DIAGNOSIS — F0390 Unspecified dementia without behavioral disturbance: Secondary | ICD-10-CM

## 2017-01-19 LAB — HEMOGLOBIN A1C
HEMOGLOBIN A1C: 4.9 % (ref 4.8–5.6)
MEAN PLASMA GLUCOSE: 94 mg/dL

## 2017-01-19 LAB — GLUCOSE, CAPILLARY
Glucose-Capillary: 97 mg/dL (ref 65–99)
Glucose-Capillary: 95 mg/dL (ref 65–99)
Glucose-Capillary: 88 mg/dL (ref 65–99)

## 2017-01-19 MED ORDER — QUETIAPINE FUMARATE 50 MG PO TABS
50.0000 mg | ORAL_TABLET | Freq: Every day | ORAL | Status: DC
  Filled 2017-01-19: qty 1

## 2017-01-19 MED ORDER — PANTOPRAZOLE SODIUM 40 MG PO PACK
40.0000 mg | PACK | Freq: Two times a day (BID) | ORAL | Status: DC
  Filled 2017-01-19: qty 20

## 2017-01-19 MED ORDER — ALBUTEROL SULFATE (2.5 MG/3ML) 0.083% IN NEBU
2.5000 mg | INHALATION_SOLUTION | RESPIRATORY_TRACT | Status: DC | PRN
  Administered 2017-01-19: 2.5 mg via RESPIRATORY_TRACT
  Filled 2017-01-19 (×3): qty 3

## 2017-01-19 MED ORDER — GUAIFENESIN-DM 100-10 MG/5ML PO SYRP
5.0000 mL | ORAL_SOLUTION | ORAL | Status: DC | PRN
  Administered 2017-01-19 (×2): 5 mL via ORAL
  Filled 2017-01-19 (×3): qty 10

## 2017-01-19 MED ORDER — PANTOPRAZOLE SODIUM 40 MG PO PACK
40.0000 mg | PACK | Freq: Two times a day (BID) | ORAL | Status: DC
  Administered 2017-01-19 – 2017-01-20 (×3): 40 mg via ORAL
  Filled 2017-01-19 (×3): qty 20

## 2017-01-19 MED ORDER — PANTOPRAZOLE SODIUM 40 MG PO PACK: 40.0000 mg | PACK | Freq: Two times a day (BID) | ORAL | Status: DC

## 2017-01-19 MED ORDER — ALUM & MAG HYDROXIDE-SIMETH 200-200-20 MG/5ML PO SUSP
30.0000 mL | ORAL | Status: DC | PRN
  Administered 2017-01-19: 30 mL via ORAL
  Filled 2017-01-19: qty 30

## 2017-01-19 NOTE — Evaluation (Signed)
Physical Therapy One Time Evaluation Patient Details Name: Sylvia Schwartz-Garcia MRN: [id number] DOB: Oct 16, 1928 Today's Date: 01/19/2017   History of Present Illness  Pt is an 83 year old female with hx of PUD and dementia admitted for Acute upper GI bleed/Anemia due to blood loss due Mallory-Weiss tear and Erosive gastritis  Clinical Impression  Patient evaluated by Physical Therapy with no further acute PT needs identified. All education has been completed and the patient has no further questions. Pt resistant to therapist assisting so family members cued pt and provided assist at gait belt for safety.  Pt able to ambulate in hallway with RW.  Family reports pt typically uses cane and requests RW for d/c home.  Family agreeable to ambulate with pt during acute stay. See below for any follow-up Physical Therapy or equipment needs. PT is signing off. Thank you for this referral.     Follow Up Recommendations No PT follow up    Equipment Recommendations  Rolling walker with 5" wheels    Recommendations for Other Services       Precautions / Restrictions Precautions Precautions: Fall      Mobility  Bed Mobility   Bed Mobility: Sit to Supine       Sit to supine: Min assist   General bed mobility comments: sitting EOB on arrival, family assisted LEs onto bed  Transfers Overall transfer level: Needs assistance Equipment used: Rolling walker (2 wheeled) Transfers: Sit to/from Stand Sit to Stand: Min guard         General transfer comment: min/guard for safety especially with lines  Ambulation/Gait Ambulation/Gait assistance: Min assist Ambulation Distance (Feet): 180 Feet Assistive device: Rolling walker (2 wheeled) Gait Pattern/deviations: Step-through pattern;Trunk flexed     General Gait Details: verbal cues for RW distance, assist for steadying and due to weakness, pt did not want therapist assisting at gait belt so daughter provided assist with PT at standby, pt  required seated rest break  Stairs            Wheelchair Mobility    Modified Rankin (Stroke Patients Only)       Balance                                             Pertinent Vitals/Pain Pain Assessment: No/denies pain    Home Living Family/patient expects to be discharged to:: Private residence Living Arrangements: Other relatives Available Help at Discharge: Family;Available 24 hours/day         Home Layout: One level Home Equipment: Cane - single point      Prior Function Level of Independence: Independent with assistive device(s)         Comments: typically uses SPC     Hand Dominance        Extremity/Trunk Assessment        Lower Extremity Assessment Lower Extremity Assessment: Generalized weakness       Communication   Communication: Prefers language other than English ([place])  Cognition Arousal/Alertness: Awake/alert Behavior During Therapy: WFL for tasks assessed/performed                   General Comments: confused today, family reports occasional confusion at baseline, also family reports pt has not been cooperative with hospital staff    General Comments      Exercises     Assessment/Plan    PT  Assessment Patent does not need any further PT services  PT Problem List            PT Treatment Interventions      PT Goals (Current goals can be found in the Care Plan section)  Acute Rehab PT Goals PT Goal Formulation: All assessment and education complete, DC therapy    Frequency     Barriers to discharge        Co-evaluation               End of Session Equipment Utilized During Treatment: Gait belt Activity Tolerance: Patient tolerated treatment well Patient left: in bed;with call bell/phone within reach;with family/visitor present Nurse Communication: Mobility status         Time: 1345-1410 PT Time Calculation (min) (ACUTE ONLY): 25 min   Charges:   PT  Evaluation $PT Eval Low Complexity: 1 Procedure     PT G Codes:        Dejion Grillo,KATHrine E 01/19/2017, 4:08 PM Zenovia Jarred, PT, DPT 01/19/2017 Pager: [phone]

## 2017-01-19 NOTE — Progress Notes (Addendum)
[facility] PROGRESS NOTE    Progress Note  Sylvia Schwartz  WUJ:[id number] DOB: Feb 23, 1928 DOA: 01/17/2017 PCP: No PCP Per Patient     Brief Narrative:   Sylvia Schwartz is an 83 y.o. female  with medical history significant of peptic ulcer disease 17 or 18 years ago, dementia who was brought to the emergency department due hematemesis at home and another episode while being transported by EMS. Comes in for coffee-ground emesis that started on the day of admission GI was consulted who proceeded with an EGD that showed several mallory weis tear  Assessment/Plan:   Acute upper GI bleed/Anemia due to blood loss due Mallory-Weiss tear and Erosive gastritis Protonix twice a day. GI was consulted, EGD performed on 01/18/2017 that showed several Mallory-Weiss tear with Her hemoglobin stable 9.7 her thrombocytosis is resolved as well as her leukocytosis. Soft Diet, Patient is not tolerating her diet No history of NSAIDs.  Hypokalemia: Resolved.    Leukocytosis/Thrombocytosis: I stressed emargination continue hold antibiotics instrument a febrile.  Dementia without behavioral disturbance: Haldol PRN  DVT prophylaxis: SCD Family Communication:daughter Disposition Plan/Barrier to D/C: home in am if tolerating diet. Code Status:     Code Status Orders        Start     Ordered   01/18/17 0145  Full code  Continuous     01/18/17 0144    Code Status History    Date Active Date Inactive Code Status Order ID Comments User Context   01/18/2017  1:44 AM  Full Code [id number]  Bobette Mo, MD ED    Advance Directive Documentation   Flowsheet Row Most Recent Value  Type of Advance Directive  Healthcare Power of Attorney  Pre-existing out of facility DNR order (yellow form or pink MOST form)  No data  "MOST" Form in Place?  No data        IV Access:    Peripheral IV   Procedures and diagnostic studies:   Dg Chest Port 1 View  Result Date:  01/18/2017 CLINICAL DATA:  Vomiting blood EXAM: PORTABLE CHEST 1 VIEW COMPARISON:  None. FINDINGS: Cardiac shadow is within normal limits. Aortic calcifications are seen. Eventration of the right hemidiaphragm is noted. Some scarring is noted in the left apex. No acute bony abnormality is seen. IMPRESSION: No acute abnormality noted. Electronically Signed   By: Alcide Clever M.D.   On: 01/18/2017 12:30     Medical Consultants:    None.  Anti-Infectives:   None  Subjective:    Sylvia Schwartz  patient is denying any abdominal pain.She did not tolerate her diet yesterday.  Objective:    Vitals:   01/19/17 0300 01/19/17 0400 01/19/17 0500 01/19/17 0600  BP:      Pulse: 75 (!) 113 (!) 114 (!) 115  Resp: 18 (!) 32 (!) 23 14  Temp:      TempSrc:      SpO2: 96% 97% 95% 96%  Weight:   61.5 kg (135 lb 9.3 oz)   Height:        Intake/Output Summary (Last 24 hours) at 01/19/17 0731 Last data filed at 01/19/17 0200  Gross per 24 hour  Intake            947.5 ml  Output              200 ml  Net            747.5 ml   Filed Weights   01/18/17 1200 01/18/17  1308 01/19/17 0500  Weight: 59 kg (130 lb) 59 kg (130 lb) 61.5 kg (135 lb 9.3 oz)    Exam: General exam: In no acute distress. Respiratory system: Good air movement and clear to auscultation. Cardiovascular system: S1 & S2 heard, RRR. No JVD. Gastrointestinal system: Abdomen is nondistended, soft and nontender.  Extremities: No pedal edema. Skin: No rashes, lesions or ulcers Psychiatry: Poor judgement and insight appear normal.    Data Reviewed:    Labs: Basic Metabolic Panel:  Recent Labs Lab 01/17/17 2013 01/17/17 2017 01/18/17 0940  NA  --  134* 139  K  --  3.0* 3.5  CL  --  99* 104  CO2  --  23 27  GLUCOSE  --  118* 87  BUN  --  36* 36*  CREATININE  --  0.80 0.71  CALCIUM  --  8.4* 8.4*  MG 1.9  --   --   PHOS 3.5  --   --    GFR Estimated Creatinine Clearance: 43.6 mL/min (by C-G formula based  on SCr of 0.71 mg/dL). Liver Function Tests:  Recent Labs Lab 01/17/17 2017 01/18/17 0940  AST 29 33  ALT 15 17  ALKPHOS 59 54  BILITOT 0.4 0.6  PROT 6.2* 6.3*  ALBUMIN 3.3* 3.4*   No results for input(s): LIPASE, AMYLASE in the last 168 hours. No results for input(s): AMMONIA in the last 168 hours. Coagulation profile No results for input(s): INR, PROTIME in the last 168 hours.  CBC:  Recent Labs Lab 01/17/17 2054 01/18/17 0940  WBC 15.8* 9.1  NEUTROABS  --  5.5  HGB 11.4* 9.7*  HCT 35.3* 29.5*  MCV 84.7 84.8  PLT 420* 387   Cardiac Enzymes:  Recent Labs Lab 01/17/17 2341 01/18/17 0940  TROPONINI 0.04* 0.03*   BNP (last 3 results) No results for input(s): PROBNP in the last 8760 hours. CBG:  Recent Labs Lab 01/18/17 1207 01/19/17 0635  GLUCAP 70 97   D-Dimer: No results for input(s): DDIMER in the last 72 hours. Hgb A1c:  Recent Labs  01/18/17 0940  HGBA1C 4.9   Lipid Profile: No results for input(s): CHOL, HDL, LDLCALC, TRIG, CHOLHDL, LDLDIRECT in the last 72 hours. Thyroid function studies: No results for input(s): TSH, T4TOTAL, T3FREE, THYROIDAB in the last 72 hours.  Invalid input(s): FREET3 Anemia work up: No results for input(s): VITAMINB12, FOLATE, FERRITIN, TIBC, IRON, RETICCTPCT in the last 72 hours. Sepsis Labs:  Recent Labs Lab 01/17/17 2054 01/18/17 0940  WBC 15.8* 9.1   Microbiology Recent Results (from the past 240 hour(s))  MRSA PCR Screening     Status: None   Collection Time: 01/18/17  2:40 PM  Result Value Ref Range Status   MRSA by PCR NEGATIVE NEGATIVE Final    Comment:        The GeneXpert MRSA Assay (FDA approved for NASAL specimens only), is one component of a comprehensive MRSA colonization surveillance program. It is not intended to diagnose MRSA infection nor to guide or monitor treatment for MRSA infections.      Medications:   . chlorhexidine  15 mL Mouth Rinse BID  . mouth rinse  15 mL  Mouth Rinse q12n4p  . pantoprazole (PROTONIX) IV  40 mg Intravenous Q12H  . QUEtiapine  25 mg Oral QHS  . sodium chloride flush  3 mL Intravenous Q12H  . sodium chloride flush  3 mL Intravenous Q12H   Continuous Infusions: . lactated ringers    .  sodium chloride 0.9 % 1,000 mL with potassium chloride 40 mEq infusion 75 mL/hr at 01/19/17 0500    Time spent: 25 min   LOS: 2 days   Marinda ElkFELIZ ORTIZ, ABRAHAM  [facility] Pager [phone]  *Please refer to [email or web address], password [email or web address] to get updated schedule on who will round on this patient, as hospitalists switch teams weekly. If 7PM-7AM, please contact night-coverage at [email or web address], password [email or web address] for any overnight needs.  01/19/2017, 7:31 AM

## 2017-01-19 NOTE — Progress Notes (Signed)
Nursing Note: IVF off per family request.Thay are afraid that pt will pull it out and wanted me to take the IV out.Explained that anything could happen and  that we  need the site in place in case we might need it quickly.Agreed to disconnect fluids for now as [email or web address]

## 2017-01-20 DIAGNOSIS — R059 Cough, unspecified: Secondary | ICD-10-CM

## 2017-01-20 DIAGNOSIS — R05 Cough: Secondary | ICD-10-CM

## 2017-01-20 LAB — GLUCOSE, CAPILLARY: GLUCOSE-CAPILLARY: 96 mg/dL (ref 65–99)

## 2017-01-20 MED ORDER — FERROUS SULFATE 325 (65 FE) MG PO TABS: 325.0000 mg | ORAL_TABLET | Freq: Every day | ORAL | 3 refills | Status: AC

## 2017-01-20 MED ORDER — SPACER/AERO CHAMBER MOUTHPIECE MISC: 1.0000 [IU] | Freq: Every day | 0 refills | Status: AC | PRN

## 2017-01-20 MED ORDER — HYDROCOD POLST-CPM POLST ER 10-8 MG/5ML PO SUER: 5.0000 mL | Freq: Two times a day (BID) | ORAL | 0 refills | Status: AC

## 2017-01-20 MED ORDER — ALBUTEROL SULFATE HFA 108 (90 BASE) MCG/ACT IN AERS: 1.0000 | INHALATION_SPRAY | Freq: Four times a day (QID) | RESPIRATORY_TRACT | 0 refills | Status: AC | PRN

## 2017-01-20 MED ORDER — PANTOPRAZOLE SODIUM 40 MG PO PACK: 40.0000 mg | PACK | Freq: Two times a day (BID) | ORAL | 3 refills | Status: DC

## 2017-01-20 MED ORDER — ALBUTEROL SULFATE (2.5 MG/3ML) 0.083% IN NEBU: 2.5000 mg | INHALATION_SOLUTION | RESPIRATORY_TRACT | 12 refills | Status: DC | PRN

## 2017-01-20 MED ORDER — LIP MEDEX EX OINT: TOPICAL_OINTMENT | CUTANEOUS | Status: DC | PRN

## 2017-01-20 NOTE — Progress Notes (Signed)
Nursing Note: BP 110/48 p-80.Will monitor.Paged on-call and made aware.wbb

## 2017-01-20 NOTE — Progress Notes (Signed)
D/c  Home with family.They verbalized understanding of appts  Scripts  and all instuctions

## 2017-01-20 NOTE — Discharge Summary (Addendum)
Physician Discharge Summary  Sylvia Schwartz MVH:[id number] DOB: 04/25/1928 DOA: 01/17/2017  PCP: No PCP Per Patient  Admit date: 01/17/2017 Discharge date: 01/30/2017  Admitted From: home Disposition:  Home  Recommendations for Outpatient Follow-up:  1. Follow up with Gastroenterology in 1-2 weeks 2. Please obtain BMP/CBC in one week   Home Health:No Equipment/Devices:None  Discharge Condition:stable CODE STATUS:full Diet recommendation: Heart Healthy   Brief/Interim Summary: 83 y.o. female with medical history significant of peptic ulcer disease 17 or 18 years ago, dementia who was brought to the emergency department due hematemesis at home and another episode while being transported by EMS. Comes in for coffee-ground emesis  Discharge Diagnoses:  Principal Problem:   Acute upper GI bleed Active Problems:   Hypokalemia   Acute blood loss anemia   Leukocytosis   Dementia   Restlessness   Melena   Hematemesis   Mallory-Weiss tear   Erosive gastritis   Cough  Acute upper GI bleed/Anemia due to blood loss due Mallory-Weiss tear and Erosive gastritis Started on IV protonix. GI was consulted, EGD performed on 01/18/2017 that showed several Mallory-Weiss tear with Her hemoglobin stable 9.7 her thrombocytosis is resolved as well as her leukocytosis. Change to Protonix orally. whichc she will cont as an outpatient. PT evaluated the patient and she had no needs.  Hypokalemia: Resolved.    Leukocytosis/Thrombocytosis: I stressed emargination continue hold antibiotics instrument a febrile.  Dementia without behavioral disturbance: Haldol PRN.  Cough: ? Due to reflux, started on PPI Family requested, cough syrup and robitussin not working. FShe was given tussinex, as family demanded something additional for the cough. riska nd side effects were explained to the family. CXR showed no inlfiltrate no fever, SOB or leukocytosis.  Discharge Instructions  Discharge  Instructions    Diet - low sodium heart healthy    Complete by:  As directed    Increase activity slowly    Complete by:  As directed      Allergies as of 01/20/2017   No Known Allergies     Medication List    STOP taking these medications   guaiFENesin 100 MG/5ML liquid Commonly known as:  ROBITUSSIN     TAKE these medications   albuterol 108 (90 Base) MCG/ACT inhaler Commonly known as:  VENTOLIN HFA Inhale 1-2 puffs into the lungs every 6 (six) hours as needed for wheezing or shortness of breath.   chlorpheniramine-HYDROcodone 10-8 MG/5ML Suer Commonly known as:  TUSSIONEX PENNKINETIC ER Take 5 mLs by mouth 2 (two) times daily.   ferrous sulfate 325 (65 FE) MG tablet Take 1 tablet (325 mg total) by mouth daily with breakfast.   Spacer/Aero Chamber Mouthpiece Misc 1 Units by Does not apply route daily as needed.      Follow-up Information    Venita Lick. Russella Dar, MD Follow up in 2 week(s).   Specialty:  Gastroenterology Why:  Hospital follow up Contact information: [address] [place] [address] [phone]          No Known Allergies  Consultations:  GI Raynelle Dick.   Procedures/Studies: Dg Chest Port 1 View  Result Date: 01/18/2017 CLINICAL DATA:  Vomiting blood EXAM: PORTABLE CHEST 1 VIEW COMPARISON:  None. FINDINGS: Cardiac shadow is within normal limits. Aortic calcifications are seen. Eventration of the right hemidiaphragm is noted. Some scarring is noted in the left apex. No acute bony abnormality is seen. IMPRESSION: No acute abnormality noted. Electronically Signed   By: Alcide Clever M.D.   On: 01/18/2017  12:30    Subjective: Family is dissatisfied with care.  Patient has no complains  Discharge Exam: Vitals:   01/20/17 0609 01/20/17 0943  BP: (!) 124/57 (!) 138/59  Pulse: 99 91  Resp: 18 16  Temp: 98 F (36.7 C) 98.8 F (37.1 C)   Vitals:   01/19/17 2316 01/19/17 2335 01/20/17 0609 01/20/17 0943  BP: (!) 96/53 (!) 110/48  (!) 124/57 (!) 138/59  Pulse: 81 80 99 91  Resp: 14  18 16   Temp: 98.4 F (36.9 C)  98 F (36.7 C) 98.8 F (37.1 C)  TempSrc: Oral  Oral Oral  SpO2: 94%  99% 94%  Weight:   62.9 kg (138 lb 10.7 oz)   Height:        General: Pt is alert, awake, not in acute distress Cardiovascular: RRR, S1/S2 +, no rubs, no gallops Respiratory: CTA bilaterally, no wheezing, no rhonchi Abdominal: Soft, NT, ND, bowel sounds + Extremities: no edema, no cyanosis    The results of significant diagnostics from this hospitalization (including imaging, microbiology, ancillary and laboratory) are listed below for reference.     Microbiology: No results found for this or any previous visit (from the past 240 hour(s)).   Labs: BNP (last 3 results) No results for input(s): BNP in the last 8760 hours. Basic Metabolic Panel: No results for input(s): NA, K, CL, CO2, GLUCOSE, BUN, CREATININE, CALCIUM, MG, PHOS in the last 168 hours. Liver Function Tests: No results for input(s): AST, ALT, ALKPHOS, BILITOT, PROT, ALBUMIN in the last 168 hours. No results for input(s): LIPASE, AMYLASE in the last 168 hours. No results for input(s): AMMONIA in the last 168 hours. CBC: No results for input(s): WBC, NEUTROABS, HGB, HCT, MCV, PLT in the last 168 hours. Cardiac Enzymes: No results for input(s): CKTOTAL, CKMB, CKMBINDEX, TROPONINI in the last 168 hours. BNP: Invalid input(s): POCBNP CBG: No results for input(s): GLUCAP in the last 168 hours. D-Dimer No results for input(s): DDIMER in the last 72 hours. Hgb A1c No results for input(s): HGBA1C in the last 72 hours. Lipid Profile No results for input(s): CHOL, HDL, LDLCALC, TRIG, CHOLHDL, LDLDIRECT in the last 72 hours. Thyroid function studies No results for input(s): TSH, T4TOTAL, T3FREE, THYROIDAB in the last 72 hours.  Invalid input(s): FREET3 Anemia work up No results for input(s): VITAMINB12, FOLATE, FERRITIN, TIBC, IRON, RETICCTPCT in the last 72  hours. Urinalysis    Component Value Date/Time   COLORURINE YELLOW 01/18/2017 0618   APPEARANCEUR HAZY (A) 01/18/2017 0618   LABSPEC 1.023 01/18/2017 0618   PHURINE 6.0 01/18/2017 0618   GLUCOSEU NEGATIVE 01/18/2017 0618   HGBUR LARGE (A) 01/18/2017 0618   BILIRUBINUR NEGATIVE 01/18/2017 0618   KETONESUR NEGATIVE 01/18/2017 0618   PROTEINUR NEGATIVE 01/18/2017 0618   NITRITE NEGATIVE 01/18/2017 0618   LEUKOCYTESUR SMALL (A) 01/18/2017 0618   Sepsis Labs Invalid input(s): PROCALCITONIN,  WBC,  LACTICIDVEN Microbiology No results found for this or any previous visit (from the past 240 hour(s)).   Time coordinating discharge: Over 30 minutes  SIGNED:   Marinda ElkFELIZ ORTIZ, ABRAHAM, MD  [facility] 01/30/2017, 8:00 AM Pager   If 7PM-7AM, please contact night-coverage [email or web address] Password [email or web address]

## 2017-01-25 ENCOUNTER — Other Ambulatory Visit: Payer: Self-pay

## 2017-01-25 ENCOUNTER — Telehealth: Payer: Self-pay | Admitting: Gastroenterology

## 2017-01-25 ENCOUNTER — Telehealth: Payer: Self-pay

## 2017-01-25 MED ORDER — PANTOPRAZOLE SODIUM 40 MG PO TBEC: 40.0000 mg | DELAYED_RELEASE_TABLET | Freq: Every day | ORAL | 3 refills | Status: DC

## 2017-01-25 MED ORDER — PANTOPRAZOLE SODIUM 40 MG PO TBEC
40.0000 mg | DELAYED_RELEASE_TABLET | Freq: Two times a day (BID) | ORAL | 0 refills | Status: DC
Start: 2017-01-25 — End: 2017-03-04

## 2017-01-25 MED ORDER — PANTOPRAZOLE SODIUM 40 MG PO TBEC: 40.0000 mg | DELAYED_RELEASE_TABLET | Freq: Two times a day (BID) | ORAL | 0 refills | Status: DC

## 2017-01-25 MED ORDER — PANTOPRAZOLE SODIUM 40 MG PO TBEC: 40.0000 mg | DELAYED_RELEASE_TABLET | Freq: Every day | ORAL | 3 refills | Status: AC

## 2017-01-25 NOTE — Telephone Encounter (Signed)
Prescriptions sent to [facility] in [place].

## 2017-01-25 NOTE — Telephone Encounter (Signed)
error 

## 2017-03-04 ENCOUNTER — Other Ambulatory Visit: Payer: Self-pay | Admitting: Gastroenterology

## 2017-09-06 IMAGING — DX DG CHEST 1V PORT
1 series · 1 of 1 positions shown · non-contrast
Comparison: None.

CLINICAL DATA: Vomiting blood

EXAM:
PORTABLE CHEST 1 VIEW

[chest ap]
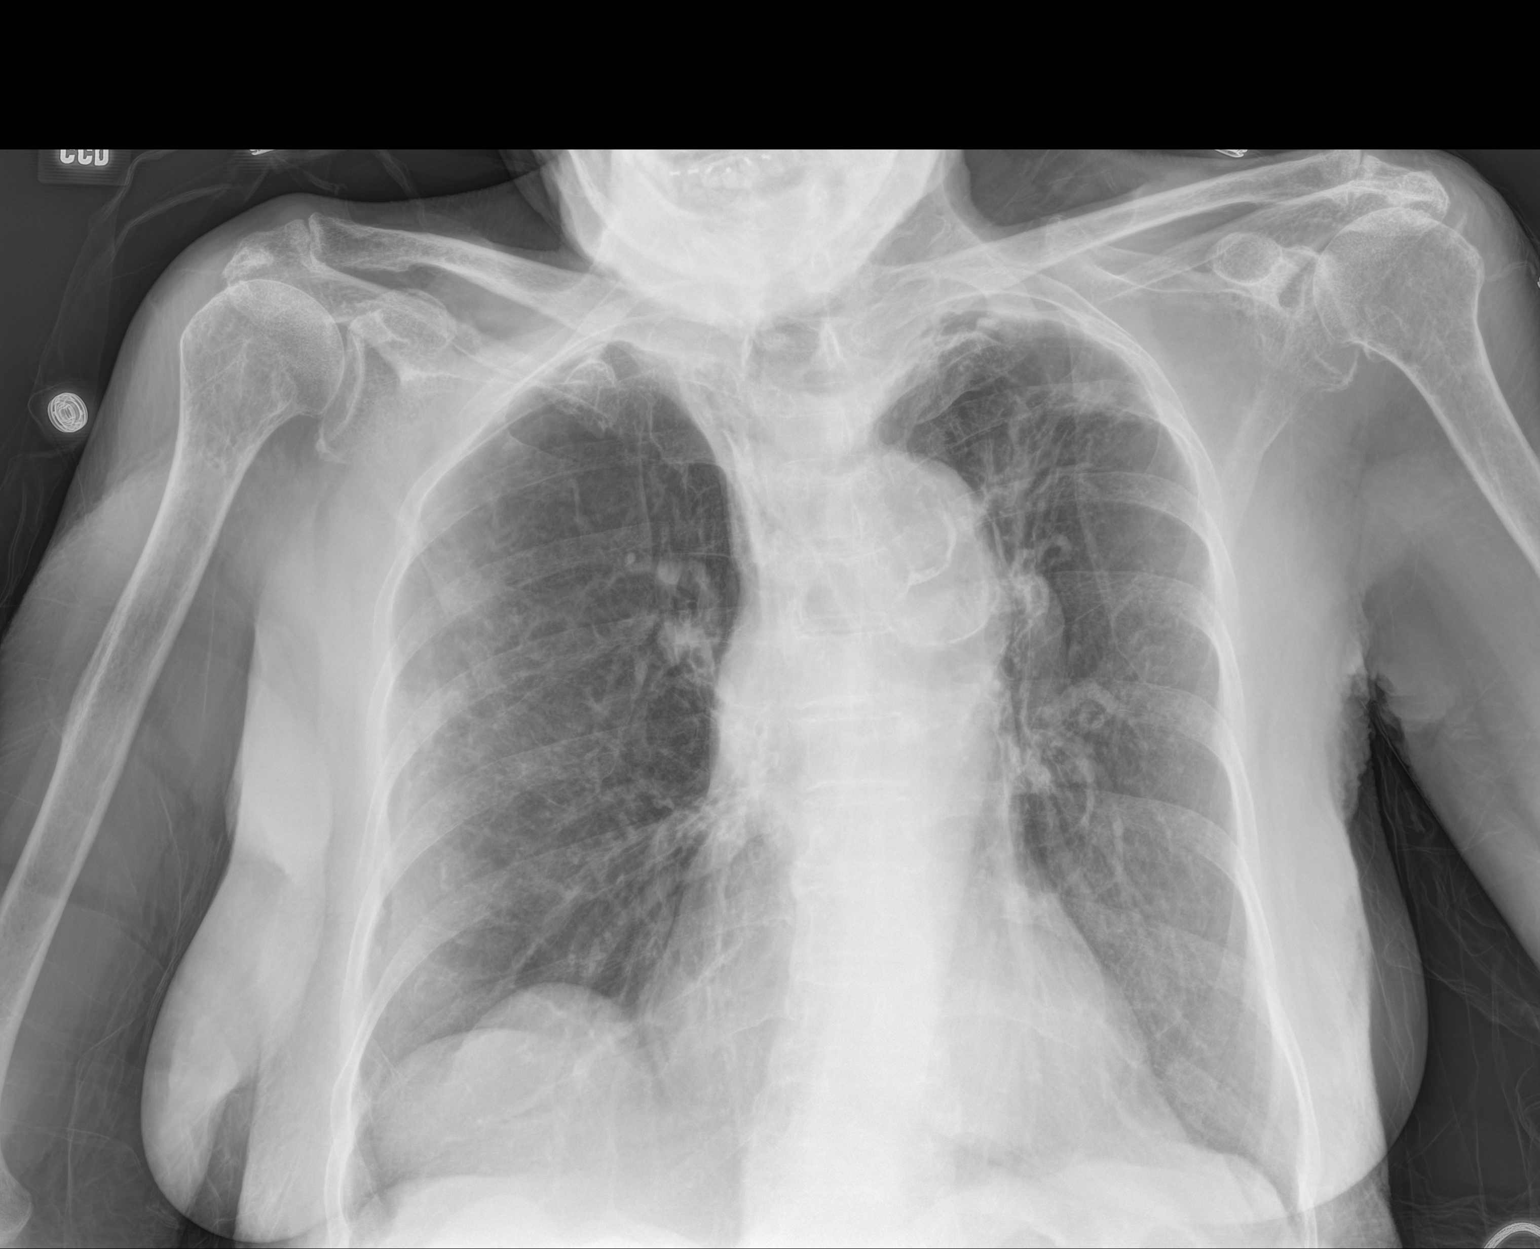

[1 of 1 positions shown; findings below may reference images not displayed]

FINDINGS: Cardiac shadow is within normal limits. Aortic calcifications are
seen. Eventration of the right hemidiaphragm is noted. Some scarring
is noted in the left apex. No acute bony abnormality is seen.
IMPRESSION: No acute abnormality noted.
